# Patient Record
Sex: Male | Born: 1985 | Race: White | Hispanic: No | Marital: Single | Smoking: Never smoker
Health system: Southern US, Community
[De-identification: ages and names within clinical notes are randomized; demographics above are authoritative.]

---

## 2016-07-27 ENCOUNTER — Ambulatory Visit (INDEPENDENT_AMBULATORY_CARE_PROVIDER_SITE_OTHER): Payer: Federal, State, Local not specified - PPO | Admitting: Sports Medicine

## 2016-07-27 ENCOUNTER — Ambulatory Visit (INDEPENDENT_AMBULATORY_CARE_PROVIDER_SITE_OTHER): Payer: Federal, State, Local not specified - PPO

## 2016-07-27 DIAGNOSIS — M25461 Effusion, right knee: Secondary | ICD-10-CM | POA: Diagnosis not present

## 2016-07-27 DIAGNOSIS — M25562 Pain in left knee: Secondary | ICD-10-CM | POA: Diagnosis not present

## 2016-07-27 DIAGNOSIS — M170 Bilateral primary osteoarthritis of knee: Secondary | ICD-10-CM | POA: Insufficient documentation

## 2016-07-27 DIAGNOSIS — M25561 Pain in right knee: Secondary | ICD-10-CM | POA: Diagnosis not present

## 2016-07-27 DIAGNOSIS — R635 Abnormal weight gain: Secondary | ICD-10-CM

## 2016-07-27 MED ORDER — MELOXICAM 15 MG PO TABS: ORAL_TABLET | ORAL | 3 refills | Status: DC

## 2016-07-27 NOTE — Assessment & Plan Note (Signed)
X-rays, MRI, aspiration and injection, meloxicam. Return in one month. Symptoms are predominantly patellofemoral.

## 2016-07-27 NOTE — Progress Notes (Signed)
   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 No mechanical symptoms, no constitutional symptoms, no trauma, but recently realized increasing swelling.  Past medical history, Surgical history, Family history not pertinant except as noted below, Social history, Allergies, and medications have been entered into the medical record, reviewed, and no changes needed.   Review of Systems: No headache, visual changes, nausea, vomiting, diarrhea, constipation, dizziness, abdominal pain, skin rash, fevers, chills, night sweats, weight loss, swollen lymph nodes, body aches, joint swelling, muscle aches, chest pain, shortness of breath, mood changes, visual or auditory hallucinations.   Objective:   General: Well Developed, well nourished, and in no acute distress.  Neuro/Psych: Alert and oriented x3, extra-ocular muscles intact, able to move all 4 extremities, sensation grossly intact. Skin: Warm and dry, no rashes noted.  Respiratory: Not using accessory muscles, speaking in full sentences, trachea midline.  Cardiovascular: Pulses palpable, no extremity edema. Abdomen: Does not appear distended. Right Knee: Visibly swollen with a palpable fluid wave, tenderness under the medial and lateral patellar facets ROM normal in flexion and extension and lower leg rotation. Ligaments with solid consistent endpoints including ACL, PCL, LCL, MCL. Negative Mcmurray's and provocative meniscal tests. Non painful patellar compression. Patellar and quadriceps tendons unremarkable. Hamstring and quadriceps strength is normal.  Procedure: Real-time Ultrasound Guided aspiration/Injection of right  knee Device: GE Logiq E  Verbal informed consent obtained.  Time-out conducted.  Noted no overlying erythema, induration, or other signs of local infection.  Skin prepped in a sterile fashion.  Local anesthesia: Topical Ethyl chloride.  With sterile technique and under real time ultrasound guidance:  Aspirated 10 mL straw-colored fluid, syringe switched and 1 mL kenalog 40, 2 mL lidocaine, 2 mL Marcaine injected easily. Completed without difficulty  Pain immediately resolved suggesting accurate placement of the medication.  Advised to call if fevers/chills, erythema, induration, drainage, or persistent bleeding.  Images permanently stored and available for review in the ultrasound unit.  Impression: Technically successful ultrasound guided injection.  X-rays reviewed and show tricompartmental osteoarthritis  Impression and Recommendations:   This case required medical decision making of moderate complexity.  Abnormal weight gain We will start phentermine a follow-up visit  Effusion of right knee X-rays, MRI, aspiration and injection, meloxicam. Return in one month. Symptoms are predominantly patellofemoral.

## 2016-07-27 NOTE — Assessment & Plan Note (Signed)
We will start phentermine a follow-up visit

## 2016-08-17 ENCOUNTER — Ambulatory Visit (INDEPENDENT_AMBULATORY_CARE_PROVIDER_SITE_OTHER): Payer: Federal, State, Local not specified - PPO | Admitting: Sports Medicine

## 2016-08-17 DIAGNOSIS — M170 Bilateral primary osteoarthritis of knee: Secondary | ICD-10-CM

## 2016-08-17 NOTE — Progress Notes (Signed)
  Subjective:    CC: Follow-up  HPI: Knee osteoarthritis: Doing significant with better after aspiration and injection, not yet ready to start weight loss treatment.  Past medical history, Surgical history, Family history not pertinant except as noted below, Social history, Allergies, and medications have been entered into the medical record, reviewed, and no changes needed.   Review of Systems: No fevers, chills, night sweats, weight loss, chest pain, or shortness of breath.   Objective:    General: Well Developed, well nourished, and in no acute distress.  Neuro: Alert and oriented x3, extra-ocular muscles intact, sensation grossly intact.  HEENT: Normocephalic, atraumatic, pupils equal round reactive to light, neck supple, no masses, no lymphadenopathy, thyroid nonpalpable.  Skin: Warm and dry, no rashes. Cardiac: Regular rate and rhythm, no murmurs rubs or gallops, no lower extremity edema.  Respiratory: Clear to auscultation bilaterally. Not using accessory muscles, speaking in full sentences. Right Knee: Normal to inspection with no erythema or effusion or obvious bony abnormalities. Palpation normal with no warmth or joint line tenderness or patellar tenderness or condyle tenderness. ROM normal in flexion and extension and lower leg rotation. Ligaments with solid consistent endpoints including ACL, PCL, LCL, MCL. Negative Mcmurray's and provocative meniscal tests. Non painful patellar compression. Patellar and quadriceps tendons unremarkable. Hamstring and quadriceps strength is normal.  Impression and Recommendations:    Primary osteoarthritis of both knees Doing much better after aspiration and injection as well as with meloxicam. Next line viscous supplementation always remains an option. Return for custom orthotics. In the future we will discuss weight loss.  I spent 25 minutes with this patient, greater than 50% was face-to-face time counseling regarding the above  diagnoses

## 2016-08-17 NOTE — Assessment & Plan Note (Signed)
Doing much better after aspiration and injection as well as with meloxicam. Next line viscous supplementation always remains an option. Return for custom orthotics. In the future we will discuss weight loss.

## 2016-10-08 ENCOUNTER — Ambulatory Visit (INDEPENDENT_AMBULATORY_CARE_PROVIDER_SITE_OTHER): Payer: Federal, State, Local not specified - PPO | Admitting: Sports Medicine

## 2016-10-08 DIAGNOSIS — R635 Abnormal weight gain: Secondary | ICD-10-CM

## 2016-10-08 DIAGNOSIS — M170 Bilateral primary osteoarthritis of knee: Secondary | ICD-10-CM | POA: Diagnosis not present

## 2016-10-08 MED ORDER — PHENTERMINE HCL 37.5 MG PO TABS: ORAL_TABLET | ORAL | 0 refills | Status: DC

## 2016-10-08 NOTE — Progress Notes (Signed)
  Subjective:    CC: Follow-up  HPI: Knee osteoarthritis: Doing well after aspiration and injection, only has a bit of soreness, has not been using his meloxicam. Has also not yet gotten his custom orthotics.  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  Obesity: Agreeable to start weight loss medication now.  Past medical history:  Negative.  See flowsheet/record as well for more information.  Surgical history: Negative.  See flowsheet/record as well for more information.  Family history: Negative.  See flowsheet/record as well for more information.  Social history: Negative.  See flowsheet/record as well for more information.  Allergies, and medications have been entered into the medical record, reviewed, and no changes needed.   Review of Systems: No fevers, chills, night sweats, weight loss, chest pain, or shortness of breath.   Objective:    General: Well Developed, well nourished, and in no acute distress.  Neuro: Alert and oriented x3, extra-ocular muscles intact, sensation grossly intact.  HEENT: Normocephalic, atraumatic, pupils equal round reactive to light, neck supple, no masses, no lymphadenopathy, thyroid nonpalpable.  Skin: Warm and dry, no rashes. Cardiac: Regular rate and rhythm, no murmurs rubs or gallops, no lower extremity edema.  Respiratory: Clear to auscultation bilaterally. Not using accessory muscles, speaking in full sentences. Left Knee: Minimal swelling with some mild tenderness at the medial joint line. ROM normal in flexion and extension and lower leg rotation. Ligaments with solid consistent endpoints including ACL, PCL, LCL, MCL. Negative Mcmurray's and provocative meniscal tests. Non painful patellar compression. Patellar and quadriceps tendons unremarkable. Hamstring and quadriceps strength is normal.  Impression and  Recommendations:    Abnormal weight gain Starting phentermine, he will do a half tab. Because of his work schedule have agreed to every 6 week visits.  Primary osteoarthritis of both knees Continues to do well, walked 30 miles over the past weekend. No complaints. He will return for custom orthotics.  I spent 40 minutes with this patient, greater than 50% was face-to-face time counseling regarding the above diagnoses

## 2016-10-08 NOTE — Assessment & Plan Note (Signed)
Starting phentermine, he will do a half tab. Because of his work schedule have agreed to every 6 week visits.

## 2016-10-08 NOTE — Assessment & Plan Note (Addendum)
Continues to do well, walked 30 miles over the past weekend. No complaints. He will return for custom orthotics.

## 2016-10-15 ENCOUNTER — Telehealth: Payer: Self-pay

## 2016-10-15 NOTE — Telephone Encounter (Signed)
Pt left VM asking if Contrave is still an option he can try. Please advise.

## 2016-10-15 NOTE — Telephone Encounter (Signed)
Yes it is.  Can be done WITH phentermine as well.

## 2016-10-17 NOTE — Telephone Encounter (Signed)
Left detailed message.   

## 2016-10-25 ENCOUNTER — Emergency Department (HOSPITAL_COMMUNITY)
Admission: EM | Admit: 2016-10-25 | Discharge: 2016-10-25 | Disposition: A | Discharge status: Home / Self Care | Payer: Federal, State, Local not specified - PPO | Attending: Emergency Medicine | Admitting: Emergency Medicine

## 2016-10-25 DIAGNOSIS — F329 Major depressive disorder, single episode, unspecified: Secondary | ICD-10-CM | POA: Diagnosis not present

## 2016-10-25 DIAGNOSIS — F418 Other specified anxiety disorders: Secondary | ICD-10-CM | POA: Diagnosis present

## 2016-10-25 DIAGNOSIS — Z79899 Other long term (current) drug therapy: Secondary | ICD-10-CM | POA: Insufficient documentation

## 2016-10-25 DIAGNOSIS — F32A Depression, unspecified: Secondary | ICD-10-CM

## 2016-10-25 NOTE — ED Triage Notes (Signed)
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  He reports battling this for several years but being concerned about treatment effecting his career.  He recently broke up with his girlfriend whom he was living with and this increased his already depressed state.  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  Patient is calm and cooperative and does not want to stay overnight.  He wants to be placed on an antidepressant.  Denies any suicidal or homicidal ideation.  Patient tearful at times but talks in a quiet controlled tone.

## 2016-10-25 NOTE — Discharge Instructions (Signed)
Please see the primary care doctor or call them to get the meds started. See the therapist. Consider seeing psychiatrist if you are not getting better. Give some time for meds to optimize and have effects. See the diabetes doctor if you feel like hormones are the cause.  Return to the ER if you are getting worse or have suicidal thoughts.

## 2016-10-26 ENCOUNTER — Telehealth: Payer: Self-pay

## 2016-10-26 NOTE — Telephone Encounter (Signed)
Alex Schmitt called and states he is ready to start the Contrave. Dr Benjamin Stainhekkekandam is off today and patient would like to go ahead and start medication. Please advise.

## 2016-10-26 NOTE — Telephone Encounter (Addendum)
He was seen in the ED for depression. He states Dr Benjamin Stainhekkekandam was going to start him on Contrave for weight loss and antidepressant. Last visit with Dr Benjamin Stainhekkekandam was for weight loss but not depression. Scheduled patient an appointment on Monday to Dr Benjamin Stainhekkekandam.

## 2016-10-26 NOTE — Telephone Encounter (Signed)
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  I'm ok with sending in an RX or even printing it out and attaching to discount coupon for him to pick up.

## 2016-10-27 NOTE — ED Provider Notes (Signed)
[facility]   CSN: [id number] Arrival date & time: 10/25/16  1747     History   Chief Complaint Chief Complaint  Patient presents with  . Medical Clearance    HPI Alex Schmitt is a 30 y.o. male.  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  He reports battling depression on his own for the past several months, but lately things have gotten out of control and he is not able to keep up with his obligations and responsibilities. He recently broke up with his girlfriend whom he was living with and this increased his already depressed state. 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  Patient is calm and cooperative and does not want to stay overnight. He denies any SI/HI. At the request of his program chair, he has come to the ER seeking antidepressant placement. Pt denies nausea, emesis, fevers, chills, chest pains, shortness of breath, headaches, abdominal pain, uti like symptoms. No drug use.   No past medical history on file.  Patient Active Problem List   Diagnosis Date Noted  . Primary osteoarthritis of both knees 07/27/2016  . Abnormal weight gain 07/27/2016    No past surgical history on file.     Home Medications    Prior to Admission medications   Medication Sig Start Date End Date Taking? Authorizing Provider  desmopressin (DDAVP) 4 MCG/ML injection Inject 2 mcg into the skin 2 (two) times daily.    Yes Historical Provider, MD  meloxicam (MOBIC) 15 MG tablet One tab PO qAM with breakfast for 2 weeks, then daily prn pain. Patient not taking: Reported on 10/25/2016 07/27/16   Monica Bectonhomas J Thekkekandam, MD  phentermine (ADIPEX-P) 37.5 MG tablet One-half tab by mouth qAM Patient not taking: Reported on 10/25/2016 10/08/16   Monica Bectonhomas J Thekkekandam, MD    Family History No family history on file.  Social History Social History  Substance Use Topics  . Smoking status: Not on file  . Smokeless  tobacco: Not on file  . Alcohol use Not on file     Allergies   Erythromycin   Review of Systems Review of Systems  ROS 10 Systems reviewed and are negative for acute change except as noted in the HPI.     Physical Exam Updated Vital Signs BP 123/74 (BP Location: Left Arm)   Pulse (!) 52   Temp 97.7 F (36.5 C) (Oral)   Resp 16   SpO2 98%   Physical Exam  Constitutional: He is oriented to person, place, and time. He appears well-developed.  HENT:  Head: Atraumatic.  Neck: Neck supple.  Cardiovascular: Normal rate.   Pulmonary/Chest: Effort normal.  Neurological: He is alert and oriented to person, place, and time.  Skin: Skin is warm.  Psychiatric:  Flat affect  Nursing note and vitals reviewed.    ED Treatments / Results  Labs (all labs ordered are listed, but only abnormal results are displayed) Labs Reviewed - No data to display  EKG  EKG Interpretation None       Radiology No results found.  Procedures Procedures (including critical care time)  Medications Ordered in ED Medications - No data to display   Initial Impression / Assessment and Plan / ED Course  I have reviewed the triage vital signs and the nursing notes.  Pertinent labs & imaging results that were available during my care of the patient were reviewed by me and considered in my medical  decision making (see chart for details).  Clinical Course     Pt appears to be suffering from major depression. His daily function now has been affected. PCP had offered to start pt on antidepressant, and pt had refused due to stigma. I have recommended seeking PCP again for help, and considering outpatient psych f/u with behavioral health. Pt has a therapist at [facility] that he likes, and he would like to continue seeing that individual. Strict ER return precautions have been discussed, and patient is agreeing with the plan and is comfortable with the workup done and the recommendations from  the ER.   Final Clinical Impressions(s) / ED Diagnoses   Final diagnoses:  Depression, unspecified depression type    New Prescriptions Discharge Medication List as of 10/25/2016  8:09 PM       Derwood KaplanAnkit Savanna Dooley, MD 10/27/16 1849

## 2016-10-29 ENCOUNTER — Ambulatory Visit (INDEPENDENT_AMBULATORY_CARE_PROVIDER_SITE_OTHER): Payer: Federal, State, Local not specified - PPO | Admitting: Sports Medicine

## 2016-10-29 DIAGNOSIS — R635 Abnormal weight gain: Secondary | ICD-10-CM | POA: Diagnosis not present

## 2016-10-29 DIAGNOSIS — F329 Major depressive disorder, single episode, unspecified: Secondary | ICD-10-CM | POA: Diagnosis not present

## 2016-10-29 MED ORDER — NALTREXONE-BUPROPION HCL ER 8-90 MG PO TB12: ORAL_TABLET | ORAL | 0 refills | Status: DC

## 2016-10-29 NOTE — Progress Notes (Signed)
  Subjective:    CC: Follow-up  HPI: Depression: Was recently seen in the emergency department, endorses severe difficulty concentrating, depressed mood is moderate, moderate overeating, guilt, mild anhedonia, trouble sleeping, poor energy, psychomotor retardation and no suicidal or homicidal ideation.  Obesity: Agreeable to start weight loss medication, he never did the phentermine prescribed at the last visit.  Past medical history:  Negative.  See flowsheet/record as well for more information.  Surgical history: Negative.  See flowsheet/record as well for more information.  Family history: Negative.  See flowsheet/record as well for more information.  Social history: Negative.  See flowsheet/record as well for more information.  Allergies, and medications have been entered into the medical record, reviewed, and no changes needed.   Review of Systems: No fevers, chills, night sweats, weight loss, chest pain, or shortness of breath.   Objective:    General: Well Developed, well nourished, and in no acute distress.  Neuro: Alert and oriented x3, extra-ocular muscles intact, sensation grossly intact.  HEENT: Normocephalic, atraumatic, pupils equal round reactive to light, neck supple, no masses, no lymphadenopathy, thyroid nonpalpable.  Skin: Warm and dry, no rashes. Cardiac: Regular rate and rhythm, no murmurs rubs or gallops, no lower extremity edema.  Respiratory: Clear to auscultation bilaterally. Not using accessory muscles, speaking in full sentences.  Impression and Recommendations:    Abnormal weight gain Discontinue phentermine, adding Contrave. Diagnosis: Obesity.  Major depression Should improve as we increase the dosage of his Contrave. If insufficient improvement we can add Wellbutrin separately.  I spent 25 minutes with this patient, greater than 50% was face-to-face time counseling regarding the above diagnoses

## 2016-10-29 NOTE — Assessment & Plan Note (Signed)
Should improve as we increase the dosage of his Contrave. If insufficient improvement we can add Wellbutrin separately.

## 2016-10-29 NOTE — Telephone Encounter (Signed)
Patient has appointment today

## 2016-10-29 NOTE — Assessment & Plan Note (Addendum)
Discontinue phentermine, adding Contrave. Diagnosis: Obesity.

## 2016-11-01 ENCOUNTER — Telehealth: Payer: Self-pay | Admitting: *Deleted

## 2016-11-01 NOTE — Telephone Encounter (Signed)
No problem, what are his thoughts about trying generic Wellbutrin XL rather than Contrave, and doing this with the phentermine?

## 2016-11-01 NOTE — Telephone Encounter (Signed)
Patient states the contrave with savings card and insurance is $100. Called the pharmacy to make sure everything ran through and did confirm that price will be $100. After speaking again with patient he states he will wants to just try  the phentermine for now. He wanted Dr. Benjamin Stainhekkekandam to know that after he spoke with you at his most recent visit "took care of some things that needed to be taken care of" and feels a little better mentally.

## 2016-11-02 NOTE — Telephone Encounter (Signed)
Left message on his vm to call back to let us know

## 2016-11-12 NOTE — Telephone Encounter (Signed)
Patient called back and stated he wanted to take a whole tablet of phentermine along with wellbutrin

## 2016-11-13 MED ORDER — PHENTERMINE HCL 37.5 MG PO TABS: ORAL_TABLET | ORAL | 0 refills | Status: DC

## 2016-11-13 MED ORDER — BUPROPION HCL ER (XL) 150 MG PO TB24: ORAL_TABLET | ORAL | 3 refills | Status: DC

## 2016-11-13 NOTE — Telephone Encounter (Signed)
Yes, that's fine, prescription for Wellbutrin and phentermine are in my box.

## 2016-11-14 NOTE — Telephone Encounter (Signed)
Patient is aware that rx was going to be sent in

## 2017-02-19 ENCOUNTER — Ambulatory Visit (INDEPENDENT_AMBULATORY_CARE_PROVIDER_SITE_OTHER): Payer: Federal, State, Local not specified - PPO | Admitting: Sports Medicine

## 2017-02-19 ENCOUNTER — Encounter: Payer: Self-pay | Admitting: Sports Medicine

## 2017-02-19 DIAGNOSIS — R635 Abnormal weight gain: Secondary | ICD-10-CM

## 2017-02-19 DIAGNOSIS — L988 Other specified disorders of the skin and subcutaneous tissue: Secondary | ICD-10-CM | POA: Insufficient documentation

## 2017-02-19 MED ORDER — PREDNISONE 50 MG PO TABS: ORAL_TABLET | ORAL | 0 refills | Status: AC

## 2017-02-19 MED ORDER — DOXYCYCLINE HYCLATE 100 MG PO TABS: 100.0000 mg | ORAL_TABLET | Freq: Two times a day (BID) | ORAL | 0 refills | Status: AC

## 2017-02-19 MED ORDER — PHENTERMINE HCL 37.5 MG PO TABS: ORAL_TABLET | ORAL | 0 refills | Status: DC

## 2017-02-19 MED ORDER — HYDROCODONE-ACETAMINOPHEN 5-325 MG PO TABS: 1.0000 | ORAL_TABLET | Freq: Three times a day (TID) | ORAL | 0 refills | Status: AC | PRN

## 2017-02-19 NOTE — Progress Notes (Signed)
  Subjective:    CC: Multiple issues  HPI: Obesity: Desires to restart phentermine, he has continued to drop weight even though he hasn't had medication in some time.  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  Depression: Never took his Wellbutrin, depressive symptoms revolved around a toxic relationship with his girlfriend, he has since left her.  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  Past medical history:  Negative.  See flowsheet/record as well for more information.  Surgical history: Negative.  See flowsheet/record as well for more information.  Family history: Negative.  See flowsheet/record as well for more information.  Social history: Negative.  See flowsheet/record as well for more information.  Allergies, and medications have been entered into the medical record, reviewed, and no changes needed.   Review of Systems: No fevers, chills, night sweats, weight loss, chest pain, or shortness of breath.   Objective:    General: Well Developed, well nourished, and in no acute distress.  Neuro: Alert and oriented x3, extra-ocular muscles intact, sensation grossly intact.  HEENT: Normocephalic, atraumatic, pupils equal round reactive to light, neck supple, no masses, no lymphadenopathy, thyroid nonpalpable.  Skin: Warm and dry, no rashes. Pilonidal disease at the gluteal cleft Cardiac: Regular rate and rhythm, no murmurs rubs or gallops, no lower extremity edema.  Respiratory: Clear to auscultation bilaterally. Not using accessory muscles, speaking in full sentences.  Complicated Incision and drainage of pilonidal disease. Risks, benefits, and alternatives explained and consent obtained. Time out conducted. Surface cleaned with alcohol. 10cc lidocaine with epinephine infiltrated around abscess. Adequate anesthesia ensured. Area prepped and draped in a  sterile fashion. #11 blade used to make a stab incision into abscess. Pus expressed with pressure. Curved hemostat used to explore 4 quadrants and loculations broken up. Further purulence expressed. Iodoform packing placed leaving a 1-inch tail, I placed a proximally 15 feet of packing into the pilonidal cyst. Hemostasis achieved. Pt stable. Aftercare and follow-up advised.  Impression and Recommendations:    Abnormal weight gain Restarting phentermine, we will consider this the first month. Return monthly for weight checks and refills.  Pilonidal disease Incision and drainage, doxycycline for 2 weeks, prednisone for 5 days. Wound culture. I placed probably 15 feet of packing which he will remove 3 inches at a time each day. Hydrocodone for postprocedural pain.

## 2017-02-19 NOTE — Assessment & Plan Note (Signed)
Incision and drainage, doxycycline for 2 weeks, prednisone for 5 days. Wound culture. I placed probably 15 feet of packing which he will remove 3 inches at a time each day. Hydrocodone for postprocedural pain.

## 2017-02-19 NOTE — Patient Instructions (Signed)
Incision and Drainage of a Pilonidal Cyst, Care After  Refer to this sheet in the next few weeks. These instructions provide you with information on caring for yourself after your procedure. Your health care provider may also give you more specific instructions. Your treatment has been planned according to current medical practices, but problems sometimes occur. Call your health care provider if you have any problems or questions after your procedure.  What can I expect after the procedure?  After your procedure, it is typical to have the following:  · Pain near or at the surgical area.  · Blood-tinged discharge on your wound packing or your bandage (dressing).     Follow these instructions at home:  · Take medicines only as directed by your health care provider.  · If you were prescribed an antibiotic medicine, finish it all even if you start to feel better.  · To prevent constipation:  ? Drink enough fluid to keep your urine clear or pale yellow.  ? Include lots of whole grains, fruits, and vegetables in your diet.  · Do not do activities that irritate or put pressure on your buttocks for about 2 weeks or as directed by your health care provider. These include bike riding, running, and anything that involves a twisting motion.  · Do not sit for long periods of time.  · Sleep on your side instead of your back.  · Ask your health care provider when you can return to work and resume your usual activities.  · Wear loose, cotton underwear.  · Keep all follow-up visits as directed by your health care provider. This is important.  If you had a surgical cut (incision) and drainage with wound packing:   · Return to your health care provider as instructed to have your packing changed or removed.  · Keep the incision area dry until your packing has been removed.  · After the packing has been removed, you can start taking showers or baths.  ? Clean your buttocks area with soap and water.  ? Pat the area dry with a soft, clean  towel.  If you had a marsupialization procedure:   · You can start taking showers or baths the day after surgery.  · Let the water from the shower or bath moisten your dressing before you remove it.  · After your shower or bath, pat your buttocks area dry with a soft, clean towel and replace your dressing.  · Ask your health care provider:  ? When you can stop using a dressing.  ? When you can start taking showers or baths.  If you had a surgical cut (incision) and drainage without packing:   Follow instructions from your health care provider about how to take care of your incision. Make sure you:  · Wash your hands with soap and water before you change your bandage (dressing). If soap and water are not available, use hand sanitizer.  · Change your dressing as told by your health care provider.  · Leave stitches (sutures), skin glue, or adhesive strips in place. These skin closures may need to stay in place for 2 weeks or longer. If adhesive strip edges start to loosen and curl up, you may trim the loose edges. Do not remove adhesive strips completely unless your health care provider tells you to do that.     Contact a health care provider if:  · Your incision is bleeding.  · You have signs of infection at your incision or around   the incision. Watch for:  ? Drainage.  ? Redness.  ? Swelling.  ? Pain.  · There is a bad smell coming from your incision site.  · Your pain medicine is not helping.  · You have a fever or chills.  · You have muscles aches.  · You are dizzy.  · You feel generally ill.  This information is not intended to replace advice given to you by your health care provider. Make sure you discuss any questions you have with your health care provider.  Document Released: 01/03/2007 Document Revised: 05/10/2016 Document Reviewed: 04/22/2014  Elsevier Interactive Patient Education © 2017 [employer].   

## 2017-02-19 NOTE — Assessment & Plan Note (Signed)
Restarting phentermine, we will consider this the first month. Return monthly for weight checks and refills.

## 2017-02-22 LAB — WOUND CULTURE
Gram Stain: NONE SEEN
Gram Stain: NONE SEEN
Gram Stain: NONE SEEN
Organism ID, Bacteria: NO GROWTH

## 2017-02-26 ENCOUNTER — Telehealth: Payer: Self-pay

## 2017-02-26 MED ORDER — SULFAMETHOXAZOLE-TRIMETHOPRIM 800-160 MG PO TABS: 1.0000 | ORAL_TABLET | Freq: Two times a day (BID) | ORAL | 0 refills | Status: AC

## 2017-02-26 NOTE — Telephone Encounter (Signed)
Left pt VM

## 2017-02-26 NOTE — Telephone Encounter (Signed)
Switching to Septra 

## 2017-02-26 NOTE — Telephone Encounter (Signed)
Pt would like to know if he can switch antibiotics. States he has had flushing, nausea and vomiting for the past day. Please advise.

## 2017-03-05 ENCOUNTER — Ambulatory Visit (INDEPENDENT_AMBULATORY_CARE_PROVIDER_SITE_OTHER): Payer: Federal, State, Local not specified - PPO | Admitting: Sports Medicine

## 2017-03-05 ENCOUNTER — Encounter: Payer: Self-pay | Admitting: Sports Medicine

## 2017-03-05 DIAGNOSIS — L988 Other specified disorders of the skin and subcutaneous tissue: Secondary | ICD-10-CM

## 2017-03-05 NOTE — Progress Notes (Signed)
  Subjective: 2 weeks post incision and drainage of pilonidal disease, overall doing extremely well, finishing his doxycycline, doesn't need pain medicine, there is no longer any pain, no drainage.   Objective: General: Well-developed, well-nourished, and in no acute distress. Low back: Gluteal cleft shows no further erythema, no tenderness, incision has closed. No drainage.  Assessment/plan:   Pilonidal disease Doing well post aggressive incision and drainage of pilonidal disease 2 weeks ago, finishing his antibiotics. No pain. No drainage. Return as needed.  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

## 2017-03-05 NOTE — Assessment & Plan Note (Signed)
Doing well post aggressive incision and drainage of pilonidal disease 2 weeks ago, finishing his antibiotics. No pain. No drainage. Return as needed.  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

## 2017-04-09 ENCOUNTER — Telehealth: Payer: Self-pay

## 2017-04-09 DIAGNOSIS — R635 Abnormal weight gain: Secondary | ICD-10-CM

## 2017-04-09 NOTE — Telephone Encounter (Signed)
Pt left VM stating he is unable to get back for an appointment until 04/29/17 and would like to know if he can get a refill of phentermine until that time. States current weight it 220lbs. Please advise.

## 2017-04-16 MED ORDER — PHENTERMINE HCL 37.5 MG PO TABS: ORAL_TABLET | ORAL | 0 refills | Status: AC

## 2017-04-16 NOTE — Telephone Encounter (Signed)
OK, refilling medication, will you enter the weight so that it shows up in the flowsheet?

## 2017-04-29 ENCOUNTER — Ambulatory Visit (INDEPENDENT_AMBULATORY_CARE_PROVIDER_SITE_OTHER): Payer: Federal, State, Local not specified - PPO

## 2017-04-29 ENCOUNTER — Ambulatory Visit (INDEPENDENT_AMBULATORY_CARE_PROVIDER_SITE_OTHER): Payer: Federal, State, Local not specified - PPO | Admitting: Sports Medicine

## 2017-04-29 DIAGNOSIS — M170 Bilateral primary osteoarthritis of knee: Secondary | ICD-10-CM

## 2017-04-29 DIAGNOSIS — M25461 Effusion, right knee: Secondary | ICD-10-CM

## 2017-04-29 MED ORDER — MELOXICAM 15 MG PO TABS: ORAL_TABLET | ORAL | 3 refills | Status: AC

## 2017-04-29 NOTE — Progress Notes (Signed)
    Patient was fitted for a : standard, cushioned, semi-rigid orthotic. The orthotic was heated and afterward the patient stood on the orthotic blank positioned on the orthotic stand. The patient was positioned in subtalar neutral position and 10 degrees of ankle dorsiflexion in a weight bearing stance. After completion of molding, a stable base was applied to the orthotic blank. The blank was ground to a stable position for weight bearing. Size: 11 [facility] Additional Posting and Padding: None The patient ambulated these, and they were very comfortable.  I spent 40 minutes with this patient, greater than 50% was face-to-face time counseling regarding the below diagnosis., This time was separate from the time spent performing the above procedure

## 2017-04-29 NOTE — Assessment & Plan Note (Addendum)
Custom orthotics as above, having pain and mechanical symptoms in the right knee, suspect anterior horn of the lateral meniscus torn. Because Viscosupplementation as the next step we do need an MRI to assess the need for arthroscopy before proceeding with Visco.

## 2017-05-03 ENCOUNTER — Encounter: Payer: Self-pay | Admitting: Sports Medicine

## 2017-05-03 ENCOUNTER — Telehealth: Payer: Self-pay | Admitting: Sports Medicine

## 2017-05-03 ENCOUNTER — Ambulatory Visit (INDEPENDENT_AMBULATORY_CARE_PROVIDER_SITE_OTHER): Payer: Federal, State, Local not specified - PPO | Admitting: Sports Medicine

## 2017-05-03 DIAGNOSIS — M170 Bilateral primary osteoarthritis of knee: Secondary | ICD-10-CM

## 2017-05-03 NOTE — Telephone Encounter (Signed)
Submitted for approval on Orthovisc. Awaiting confirmation.  

## 2017-05-03 NOTE — Progress Notes (Signed)
  Subjective:    CC: Follow-up  HPI: Right knee pain: X-ray confirmed osteoarthritis, MRI also showed the same, mostly patellofemoral osteoarthritis. At this point he has failed custom orthotics, rehabilitation exercises, NSAIDs, and steroid injection, pain is moderate, persistent, localized anteriorly without radiation.  Past medical history:  Negative.  See flowsheet/record as well for more information.  Surgical history: Negative.  See flowsheet/record as well for more information.  Family history: Negative.  See flowsheet/record as well for more information.  Social history: Negative.  See flowsheet/record as well for more information.  Allergies, and medications have been entered into the medical record, reviewed, and no changes needed.   Review of Systems: No fevers, chills, night sweats, weight loss, chest pain, or shortness of breath.   Objective:    General: Well Developed, well nourished, and in no acute distress.  Neuro: Alert and oriented x3, extra-ocular muscles intact, sensation grossly intact.  HEENT: Normocephalic, atraumatic, pupils equal round reactive to light, neck supple, no masses, no lymphadenopathy, thyroid nonpalpable.  Skin: Warm and dry, no rashes. Cardiac: Regular rate and rhythm, no murmurs rubs or gallops, no lower extremity edema.  Respiratory: Clear to auscultation bilaterally. Not using accessory muscles, speaking in full sentences.  Impression and Recommendations:    Primary osteoarthritis of both knees MRI showed simple osteoarthritis, steroid injection was moderately beneficial, custom orthotics are doing well. At this point we are going to start Visco for the right knee only. Return to start injections. 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  I spent 25 minutes with this patient, greater than 50% was face-to-face time counseling regarding the above diagnoses

## 2017-05-03 NOTE — Assessment & Plan Note (Signed)
MRI showed simple osteoarthritis, steroid injection was moderately beneficial, custom orthotics are doing well. At this point we are going to start Visco for the right knee only. Return to start injections. 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

## 2017-05-03 NOTE — Telephone Encounter (Signed)
-----   Message from Monica Bectonhomas J Thekkekandam, MD sent at 05/03/2017  2:52 PM EDT ----- Orthovisc right knee please Alex Schmitt. XR and MRI confirmed, failed NSAIDS, orthotics, rehab, steroid injection. ___________________________________________ Alex Austinhomas J. Benjamin Stainhekkekandam, M.D., ABFM., CAQSM. Primary Care and Sports Medicine [facility]  Adjunct Instructor of Family Medicine  [employer]

## 2017-06-12 NOTE — Telephone Encounter (Signed)
Received the following information from [employer] benefits investigation:   Orthovisc is covered. Buy and bill is allowed. The family deductible has been met and the insurance will cover 85% or the allowable amount. The patient has a 15% coinsurance responsibility. Once the out of pocket is met, insurance will cover 100% of the allowable amount. A co pay applies if an office visit is billed in the amount of $35. Prior authorization is required. The pharmacy option is available through [facility], [phone]. Call reference number is [id number].   Left VM to update Pt regarding estimated OOP cost. If Pt is OK with cost, then PA will need to be completed.

## 2017-12-30 IMAGING — MR MR KNEE*R* W/O CM
5 series · 39 of 40 positions shown · non-contrast
Comparison: None.

CLINICAL DATA: Working out, clicking and popping. Fluid removed in
the past. Pain worse last [DATE] months. Primary osteoarthritis of
both knees

EXAM:
MRI OF THE RIGHT KNEE WITHOUT CONTRAST
TECHNIQUE: Multiplanar, multisequence MR imaging of the knee was performed. No
intravenous contrast was administered.

[Series 3: PD fat-sat · axial · 3.0mm · 0.33mm/px · z∈[-83,+32]mm · 8 of 36 slices shown (1 of 3)]
[im 1/36]
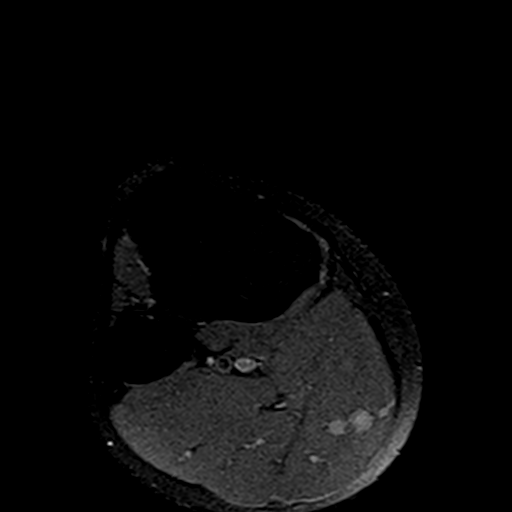
[im 6/36]
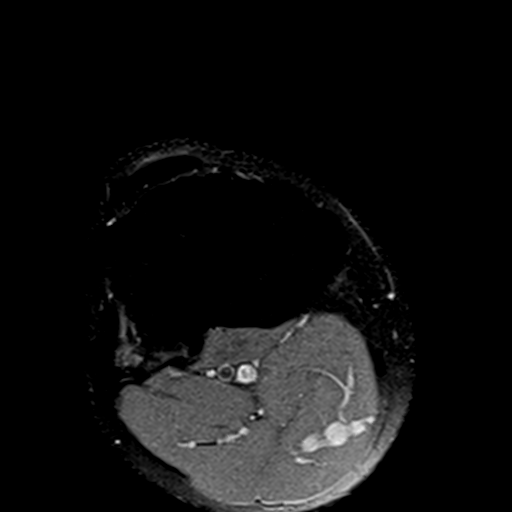
[im 11/36]
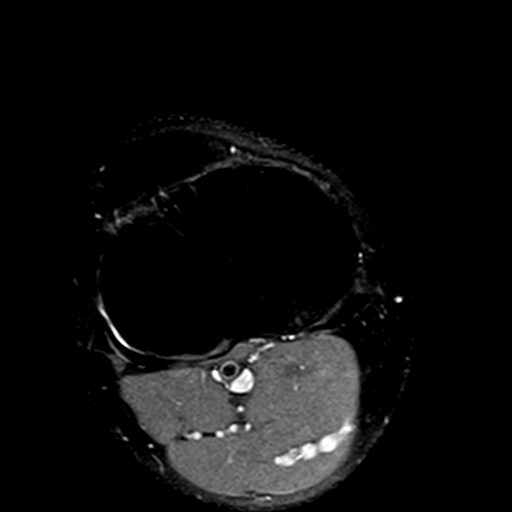
[im 16/36]
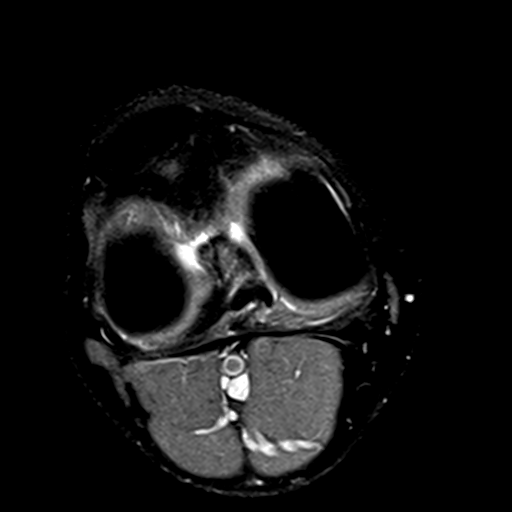
[im 21/36]
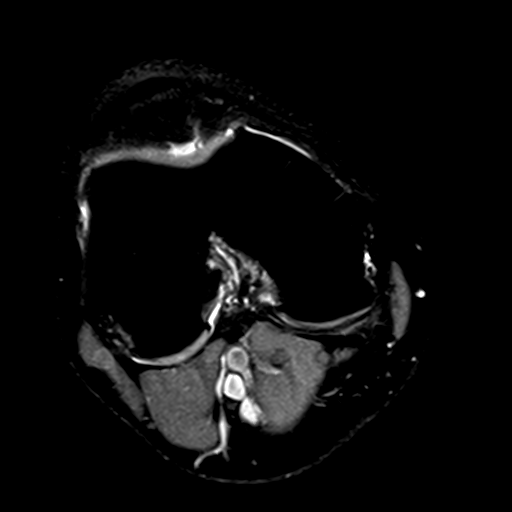
[im 26/36]
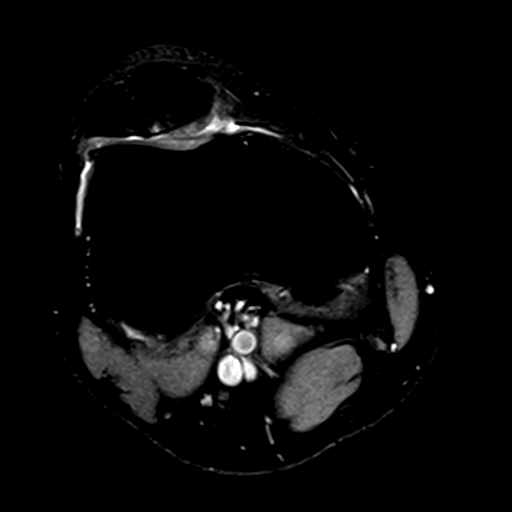
[im 31/36]
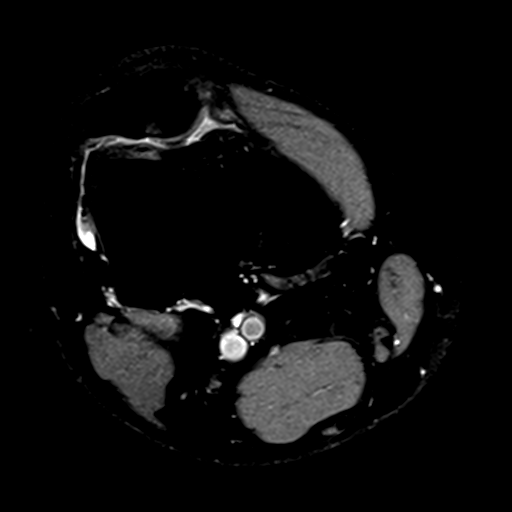
[im 36/36]
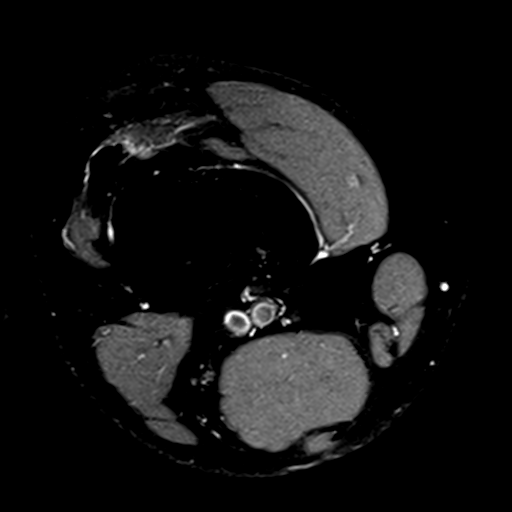

[Series 4: T1 · coronal · 3.0mm · 0.50mm/px · 7 of 35 slices shown]
[im 1/35]
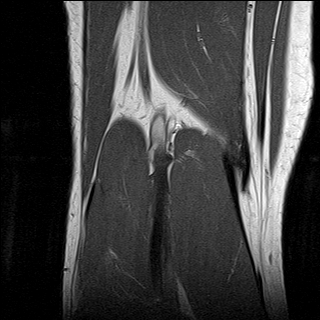
[im 5/35]
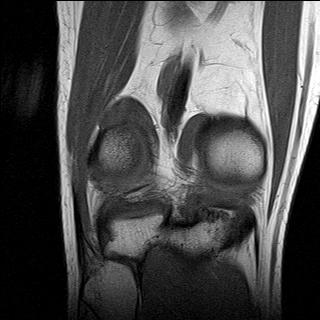
[im 10/35]
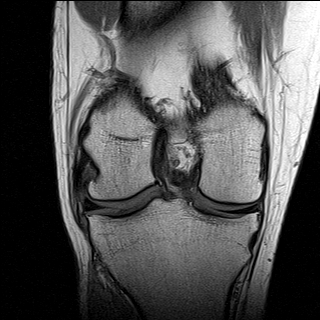
[im 15/35]
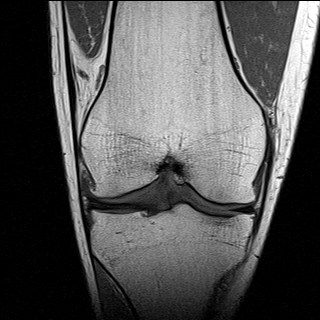
[im 20/35]
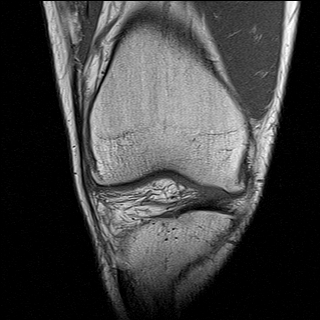
[im 25/35]
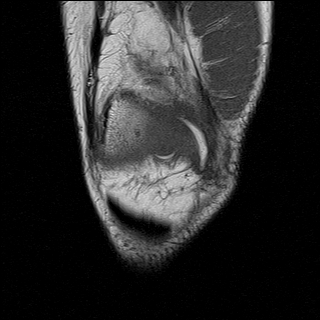
[im 30/35]
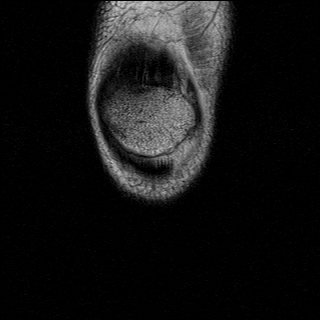

[Series 5: T2 fat-sat · coronal · 3.0mm · 0.50mm/px · 8 of 35 slices shown]
[im 1/35]
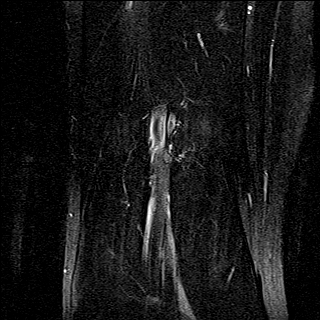
[im 5/35]
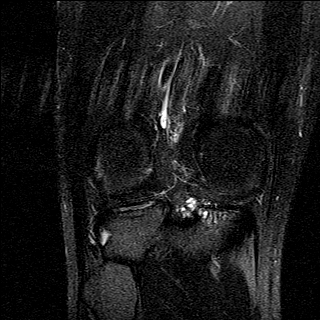
[im 10/35]
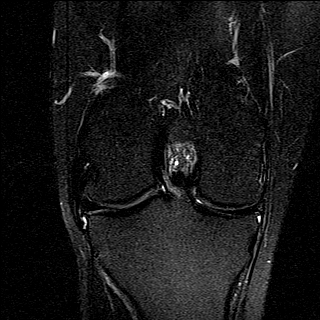
[im 15/35]
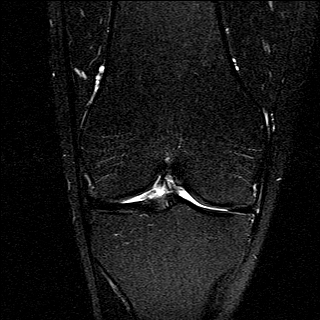
[im 20/35]
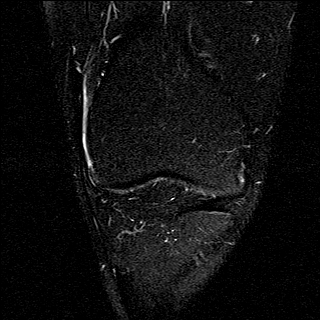
[im 25/35]
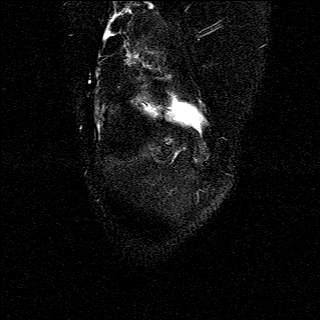
[im 30/35]
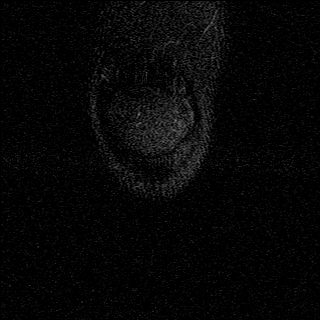
[im 35/35]
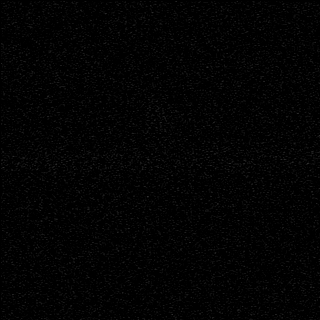

[Series 6: PD fat-sat · coronal · 3.0mm · 0.62mm/px · 8 of 35 slices shown (2 of 3)]
[im 1/35]
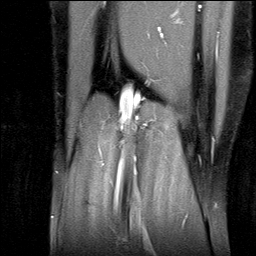
[im 5/35]
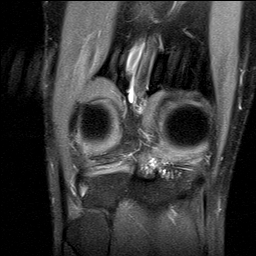
[im 10/35]
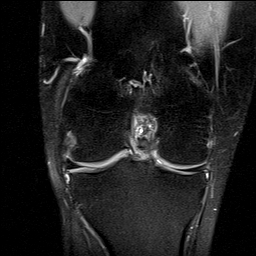
[im 15/35]
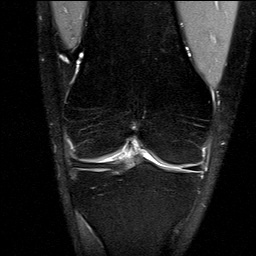
[im 20/35]
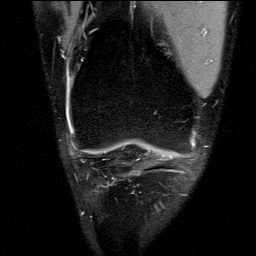
[im 25/35]
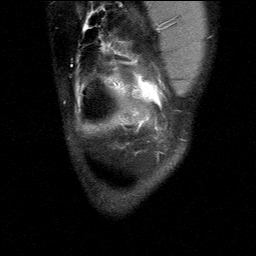
[im 30/35]
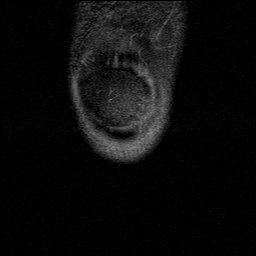
[im 35/35]
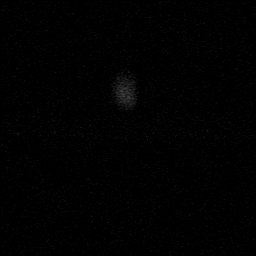

[Series 7: PD fat-sat · sagittal · 3.0mm · 0.66mm/px · 8 of 37 slices shown (3 of 3)]
[im 1/37]
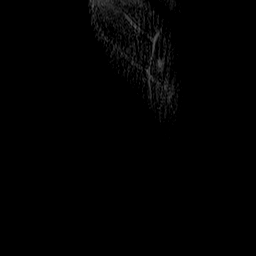
[im 6/37]
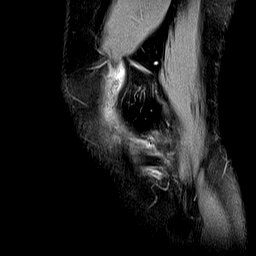
[im 11/37]
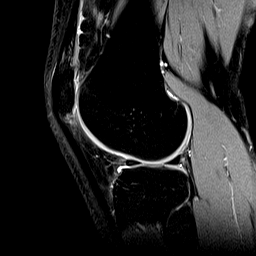
[im 16/37]
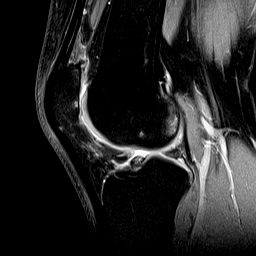
[im 21/37]
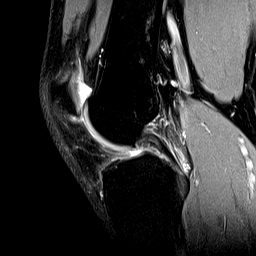
[im 26/37]
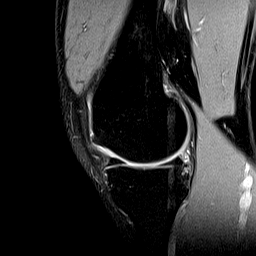
[im 31/37]
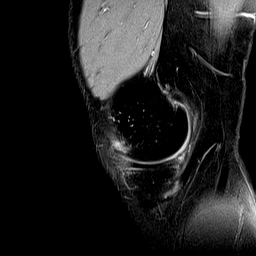
[im 37/37]
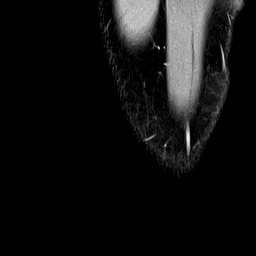

[39 of 40 positions shown; findings below may reference images not displayed]

FINDINGS: MENISCI

Medial meniscus:  Intact.

Lateral meniscus:  Intact.

LIGAMENTS

Cruciates:  Intact ACL and PCL.

Collaterals: Medial collateral ligament is intact. Lateral
collateral ligament complex is intact.

CARTILAGE

Patellofemoral: Full-thickness cartilage loss of the lateral
patellar facet. Partial-thickness cartilage loss of the lateral
trochlea. Partial-thickness cartilage loss of the medial patellar
facet.

Medial:  No chondral defect.

Lateral:  No chondral defect.

Joint: No joint effusion. Mild edema in superolateral Hoffa's fat.
No plical thickening.

Popliteal Fossa:  No Baker cyst. Intact popliteus tendon.

Extensor Mechanism: Intact quadriceps tendon and patellar tendon.
Intact medial and lateral patellar retinaculum. Intact MPFL.

Bones: No focal marrow signal abnormality. No fracture or
dislocation. Small lateral femorotibial compartment marginal
osteophytes.

Other: No fluid collection or hematoma.
IMPRESSION: 1. Full-thickness cartilage loss of the lateral patellar facet.
Partial-thickness cartilage loss of the lateral trochlea.
Partial-thickness cartilage loss of the medial patellar facet.
2. No meniscal or ligamentous injury of the right knee.
